# Patient Record
Sex: Male | Born: 1960 | Race: White | Hispanic: No | Marital: Married | Smoking: Never smoker
Health system: Southern US, Community
[De-identification: ages and names within clinical notes are randomized; demographics above are authoritative.]

## PROBLEM LIST (undated history)

## (undated) DIAGNOSIS — K219 Gastro-esophageal reflux disease without esophagitis: Secondary | ICD-10-CM

## (undated) DIAGNOSIS — T7840XA Allergy, unspecified, initial encounter: Secondary | ICD-10-CM

## (undated) HISTORY — DX: Allergy, unspecified, initial encounter: T78.40XA

## (undated) HISTORY — PX: VARICOCELECTOMY: SHX1084

## (undated) HISTORY — DX: Gastro-esophageal reflux disease without esophagitis: K21.9

## (undated) HISTORY — PX: OTHER SURGICAL HISTORY: SHX169

## (undated) HISTORY — PX: EYE SURGERY: SHX253

---

## 2011-03-16 ENCOUNTER — Ambulatory Visit (HOSPITAL_COMMUNITY): Payer: BC Managed Care – PPO

## 2011-03-16 ENCOUNTER — Emergency Department (HOSPITAL_COMMUNITY)
Admission: EM | Admit: 2011-03-16 | Discharge: 2011-03-16 | Discharge status: Against Medical Advice | Payer: BC Managed Care – PPO | Attending: Emergency Medicine | Admitting: Emergency Medicine

## 2011-03-16 ENCOUNTER — Emergency Department (HOSPITAL_COMMUNITY): Payer: BC Managed Care – PPO

## 2011-03-16 DIAGNOSIS — R0989 Other specified symptoms and signs involving the circulatory and respiratory systems: Secondary | ICD-10-CM | POA: Insufficient documentation

## 2011-03-16 DIAGNOSIS — R0609 Other forms of dyspnea: Secondary | ICD-10-CM | POA: Insufficient documentation

## 2011-03-16 DIAGNOSIS — R490 Dysphonia: Secondary | ICD-10-CM | POA: Insufficient documentation

## 2011-03-16 DIAGNOSIS — R1310 Dysphagia, unspecified: Secondary | ICD-10-CM | POA: Insufficient documentation

## 2011-04-17 ENCOUNTER — Other Ambulatory Visit: Payer: Self-pay | Admitting: Otolaryngology

## 2011-04-17 DIAGNOSIS — R1310 Dysphagia, unspecified: Secondary | ICD-10-CM

## 2011-04-19 ENCOUNTER — Ambulatory Visit
Admission: RE | Admit: 2011-04-19 | Discharge: 2011-04-19 | Disposition: A | Discharge status: Home / Self Care | Payer: BC Managed Care – PPO | Source: Ambulatory Visit | Attending: Otolaryngology | Admitting: Otolaryngology

## 2011-04-19 DIAGNOSIS — R1310 Dysphagia, unspecified: Secondary | ICD-10-CM

## 2011-04-23 ENCOUNTER — Other Ambulatory Visit: Payer: Self-pay | Admitting: Otolaryngology

## 2011-04-23 DIAGNOSIS — R1310 Dysphagia, unspecified: Secondary | ICD-10-CM

## 2011-04-26 ENCOUNTER — Ambulatory Visit
Admission: RE | Admit: 2011-04-26 | Discharge: 2011-04-26 | Disposition: A | Discharge status: Home / Self Care | Payer: BC Managed Care – PPO | Source: Ambulatory Visit | Attending: Otolaryngology | Admitting: Otolaryngology

## 2011-04-26 DIAGNOSIS — R1310 Dysphagia, unspecified: Secondary | ICD-10-CM

## 2011-04-26 MED ORDER — IOHEXOL 300 MG/ML  SOLN
100.0000 mL | Freq: Once | INTRAMUSCULAR | Status: AC | PRN
  Administered 2011-04-26: 100 mL via INTRAVENOUS

## 2011-05-22 ENCOUNTER — Encounter: Payer: Self-pay | Admitting: Internal Medicine

## 2011-05-24 ENCOUNTER — Encounter: Payer: Self-pay | Admitting: *Deleted

## 2011-05-27 ENCOUNTER — Encounter: Payer: Self-pay | Admitting: Internal Medicine

## 2011-05-27 ENCOUNTER — Ambulatory Visit (INDEPENDENT_AMBULATORY_CARE_PROVIDER_SITE_OTHER): Payer: BC Managed Care – PPO | Admitting: Internal Medicine

## 2011-05-27 VITALS — BP 118/62 | HR 64 | Ht 71.0 in | Wt 168.0 lb

## 2011-05-27 DIAGNOSIS — R1310 Dysphagia, unspecified: Secondary | ICD-10-CM

## 2011-05-27 DIAGNOSIS — K219 Gastro-esophageal reflux disease without esophagitis: Secondary | ICD-10-CM

## 2011-05-27 DIAGNOSIS — Z1211 Encounter for screening for malignant neoplasm of colon: Secondary | ICD-10-CM

## 2011-05-27 MED ORDER — PEG-KCL-NACL-NASULF-NA ASC-C 100 G PO SOLR: 1.0000 | Freq: Once | ORAL | Status: DC

## 2011-05-27 NOTE — Patient Instructions (Addendum)
You have been scheduled for a endoscopy and colonoscopy on 07/02/11, instructions provided. A prescription for the prep has been sent to your pharmacy.

## 2011-05-27 NOTE — Progress Notes (Signed)
HISTORY OF PRESENT ILLNESS:  Jose Rodriguez is a 50 y.o. male with no significant past medical history who is in today regarding new-onset dysphagia. The patient describes new onset intermittent solid food dysphagia beginning in early September 2012. As well, mild weight loss. He underwent ENT evaluation. The perianal softener and was performed 04/19/2011. This was said to show significant reflux, small hiatal hernia, and possible extrinsic indentation on the lower cervical esophagus. However, a barium tablet passed without delay. Subsequently, a CT scan of the neck with contrast was performed. There was no evidence for soft tissue mass or abnormality. Incidental T2 hemangioma noted. Patient denies any classic reflux symptoms. However, he was placed on PPI therapy. Currently on omeprazole 20 mg daily. Since that, he states that his symptoms have improved significantly. He continues to have no difficulties with liquids. Some difficulty with stringy items such as spinach or pizza. His weight is back to baseline. No lower GI complaints. Father with a history of adenomatous colon polyps.  REVIEW OF SYSTEMS:  All non-GI ROS negative except for waking at night  Past Medical History  Diagnosis Date  . Esophageal reflux   . Hemangioma of vertebral body     Past Surgical History  Procedure Date  . Varicose veins surgery 1992  . Cataract surgery 2008, 2010    Both eyes, different times.    Social History Jose Rodriguez  reports that he has never smoked. He does not have any smokeless tobacco history on file. He reports that he drinks alcohol. He reports that he does not use illicit drugs.  family history includes Asthma in his father; Cancer in his mother; Colon polyps in his father; and Sleep apnea in his father.  No Known Allergies     PHYSICAL EXAMINATION: Vital signs: BP 118/62  Pulse 64  Ht 5\' 11"  (1.803 m)  Wt 168 lb (76.204 kg)  BMI 23.43 kg/m2  Constitutional: generally  well-appearing, no acute distress Psychiatric: alert and oriented x3, cooperative Eyes: extraocular movements intact, anicteric, conjunctiva pink Mouth: oral pharynx moist, no lesions Neck: supple no lymphadenopathy Cardiovascular: heart regular rate and rhythm, no murmur Lungs: clear to auscultation bilaterally Abdomen: soft, nontender, nondistended, no obvious ascites, no peritoneal signs, normal bowel sounds, no organomegaly Rectal: Deferred until colonoscopy Extremities: no lower extremity edema bilaterally Skin: no lesions on visible extremities Neuro: No focal deficits.  ASSESSMENT:  #1. Vague intermittent solid food dysphagia improved on PPI. Essentially negative esophagram, except for evidence of reflux. Negative CT of the neck as well. Possible causes include subtle ring or stricture not appreciated on esophagram versus reflux induced edema---improved on PPI. #2. Colon cancer screening. Baseline risk. appropriate candidate without contraindication. I discussed this with him in detail. He is interested.   PLAN:  #1. Continue PPI #2. Upper endoscopy with possible esophageal dilation.The nature of the procedure, as well as the risks, benefits, and alternatives were carefully and thoroughly reviewed with the patient. Ample time for discussion and questions allowed. The patient understood, was satisfied, and agreed to proceed.  #3. Screening colonoscopy. Movi prep prescribed.The nature of the procedure, as well as the risks, benefits, and alternatives were carefully and thoroughly reviewed with the patient. Ample time for discussion and questions allowed. The patient understood, was satisfied, and agreed to proceed.

## 2011-07-02 ENCOUNTER — Encounter: Payer: BC Managed Care – PPO | Admitting: Internal Medicine

## 2011-07-22 ENCOUNTER — Ambulatory Visit (AMBULATORY_SURGERY_CENTER): Payer: BC Managed Care – PPO | Admitting: Internal Medicine

## 2011-07-22 ENCOUNTER — Encounter: Payer: Self-pay | Admitting: Internal Medicine

## 2011-07-22 VITALS — BP 125/76 | HR 75 | Temp 97.4°F | Resp 18 | Ht 71.0 in | Wt 168.0 lb

## 2011-07-22 DIAGNOSIS — K219 Gastro-esophageal reflux disease without esophagitis: Secondary | ICD-10-CM

## 2011-07-22 DIAGNOSIS — R1310 Dysphagia, unspecified: Secondary | ICD-10-CM

## 2011-07-22 DIAGNOSIS — K222 Esophageal obstruction: Secondary | ICD-10-CM

## 2011-07-22 DIAGNOSIS — Z1211 Encounter for screening for malignant neoplasm of colon: Secondary | ICD-10-CM

## 2011-07-22 MED ORDER — SODIUM CHLORIDE 0.9 % IV SOLN: 500.0000 mL | INTRAVENOUS | Status: DC

## 2011-07-22 NOTE — Progress Notes (Signed)
Patient did not experience any of the following events: a burn prior to discharge; a fall within the facility; wrong site/side/patient/procedure/implant event; or a hospital transfer or hospital admission upon discharge from the facility. (G8907) Patient did not have preoperative order for IV antibiotic SSI prophylaxis. (G8918)  

## 2011-07-22 NOTE — Op Note (Signed)
[facility] [address] [place], [place]  [address]  ENDOSCOPY PROCEDURE REPORT  PATIENT:  Jose Rodriguez, Jose Rodriguez  MR#:  [id number] BIRTHDATE:  1961-04-24, 50 yrs. old  GENDER:  male  ENDOSCOPIST:  Wilhemina Bonito. Eda Keys, MD Referred by:  Herb Grays, M.D.  PROCEDURE DATE:  07/22/2011 PROCEDURE:  EGD, diagnostic 43235, Maloney Dilation of Esophagus - 7F ASA CLASS:  Class I INDICATIONS:  dysphagia, GERD  MEDICATIONS:   MAC sedation, administered by CRNA, propofol (Diprivan) 150 mg IV TOPICAL ANESTHETIC:  none  DESCRIPTION OF PROCEDURE:   After the risks benefits and alternatives of the procedure were thoroughly explained, informed consent was obtained.  The LB GIF-H180 [id number] endoscope was introduced through the mouth and advanced to the second portion of the duodenum, without limitations.  The instrument was slowly withdrawn as the mucosa was fully examined. <<PROCEDUREIMAGES>>  A 15mm Schatzki's ring was found in the distal esophagus. Otherwise normal esophagus.  The stomach was entered and closely examined. The antrum, angularis, and lesser curvature were well visualized, including a retroflexed view of the cardia and fundus. The stomach wall was normally distensable. The scope passed easily through the pylorus into the duodenum.  The duodenal bulb was normal in appearance, as was the postbulbar duodenum. Retroflexed views revealed no abnormalities.    The scope was then withdrawn from the patient and the procedure completed.  THERAPY: 7F MALONEY DILATOR PASSED W/O RESISTANCE OR HEME.  COMPLICATIONS:  None  ENDOSCOPIC IMPRESSION: 1) Schatzki's ring in the distal esophagus - S/P DILATION 2) Otherwise normal esophagus 3) Normal stomach 4) Normal duodenum  RECOMMENDATIONS: 1) Clear liquids until 2 PM, then soft foods rest of day. Resume prior diet tomorrow. 2) Continue OMEPRAZOLE 3) Follow-up AS NEEDED  ______________________________ Wilhemina Bonito. Eda Keys,  MD  CC:  Herb Grays, MD;  The Patient  n. eSIGNED:   Wilhemina Bonito. Eda Keys at 07/22/2011 11:16 AM  Antony Blackbird, [phone]

## 2011-07-22 NOTE — Patient Instructions (Signed)
FOLLOW THE DISCHARGE INSTRUCTIONS ON THE BLUE AND GREEN INSTRUCTION SHEETS.  CONTINUE YOUR MEDICATIONS. CONTINUE OMEPRAZOLE.  FOLLOW POST ESOPHAGEAL DILATATION DIET INSTRUCTIONS:  NOTHING TO EAT OR DRINK UNTIL 1215. CLEAR LIQUIDS 12:15 UNTIL 2 PM. SOFT FOODS FROM 2 PM UNTIL TOMORROW. RESUME REGULAR DIET TOMORROW.  NEXT COLONOSCOPY IN 10 YEARS.

## 2011-07-22 NOTE — Op Note (Signed)
[facility] [address] [place], [place]  [address]  COLONOSCOPY PROCEDURE REPORT  PATIENT:  Jose Rodriguez, Jose Rodriguez  MR#:  [id number] BIRTHDATE:  27-Nov-1960, 50 yrs. old  GENDER:  male ENDOSCOPIST:  Wilhemina Bonito. Eda Keys, MD REF. BY:  Herb Grays, M.D. PROCEDURE DATE:  07/22/2011 PROCEDURE:  Average-risk screening colonoscopy G0121 ASA CLASS:  Class I INDICATIONS:  Routine Risk Screening MEDICATIONS:   MAC sedation, administered by CRNA, propofol (Diprivan) 250 mg IV  DESCRIPTION OF PROCEDURE:   After the risks benefits and alternatives of the procedure were thoroughly explained, informed consent was obtained.  Digital rectal exam was performed and revealed no abnormalities.   The LB 180AL [id number] endoscope was introduced through the anus and advanced to the cecum, which was identified by both the appendix and ileocecal valve, without limitations.  The quality of the prep was good, using MoviPrep. The instrument was then slowly withdrawn as the colon was fully examined. <<PROCEDUREIMAGES>>  FINDINGS:  A normal appearing cecum, ileocecal valve, and appendiceal orifice were identified. The ascending, hepatic flexure, transverse, splenic flexure, descending, sigmoid colon, and rectum appeared unremarkable.  No polyps or cancers were seen. Retroflexed views in the rectum revealed internal hemorrhoids. The time to cecum = 1:41  minutes. The scope was then withdrawn in 11:39  minutes from the cecum and the procedure completed.  COMPLICATIONS:  None  ENDOSCOPIC IMPRESSION: 1) Normal colonoscopy 2) No polyps or cancers 3) Small Internal hemorrhoids  RECOMMENDATIONS: 1) Continue current colorectal screening recommendations for "routine risk" patients with a repeat colonoscopy in 10 years. 2) Upper endoscopy today  ______________________________ Wilhemina Bonito. Eda Keys, MD  CC:  Herb Grays, MD;  The Patient  n. eSIGNED:   Wilhemina Bonito. Eda Keys at 07/22/2011 11:09 AM  Antony Blackbird, [phone]

## 2011-07-24 ENCOUNTER — Telehealth: Payer: Self-pay | Admitting: *Deleted

## 2011-07-24 NOTE — Telephone Encounter (Signed)
No answer, message left

## 2011-12-12 IMAGING — RF DG ESOPHAGUS
17 of 21 series · 19 of 24 positions shown · non-contrast
Comparison: None.

CLINICAL DATA: Dysphagia

ESOPHOGRAM/BARIUM SWALLOW
TECHNIQUE: Combined double contrast and single contrast
examination performed using effervescent crystals, thick barium
liquid, and thin barium liquid.
Fluoroscopy time:  1.2 minutes.

[Series 1: run · 2 of 6 slices shown (1 of 17)]
[im 1/6]
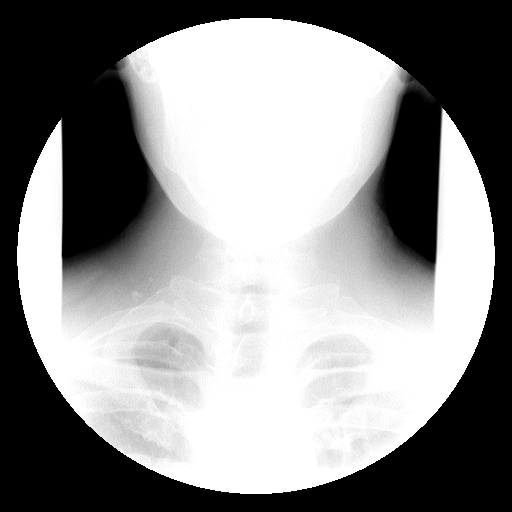
[im 3/6]
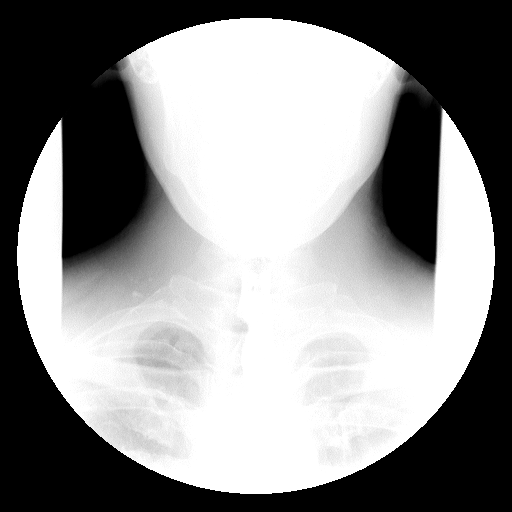

[Series 2: run · 2 of 4 slices shown (2 of 17)]
[im 1/4]
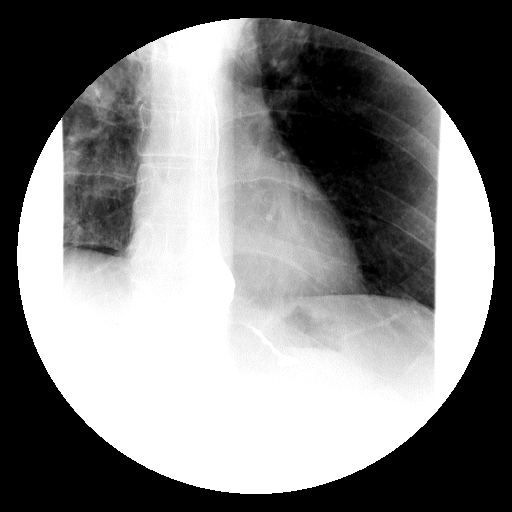
[im 4/4]
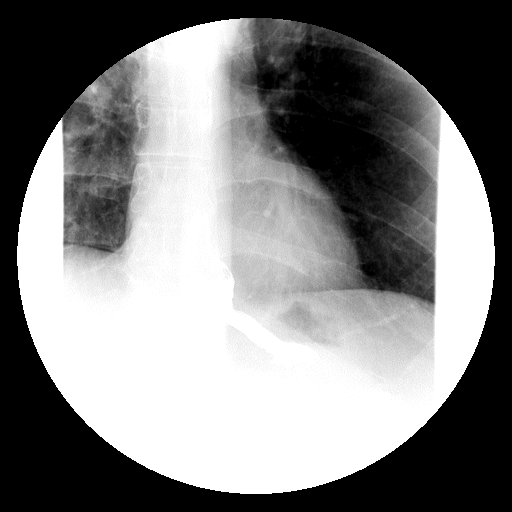

[Series 3: run · 1 of 1 slices shown (3 of 17)]
[im 1/1]
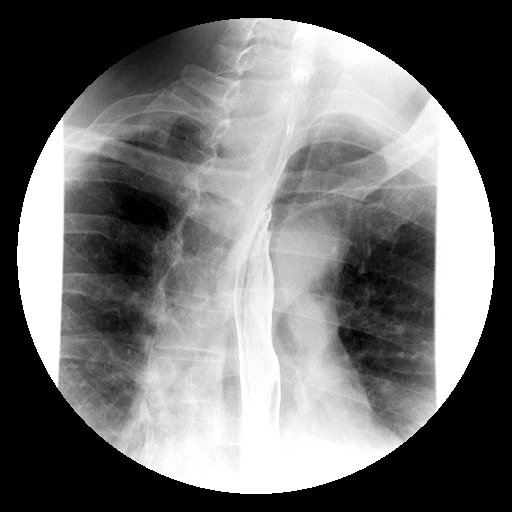

[Series 4: run · 1 of 1 slices shown (4 of 17)]
[im 1/1]
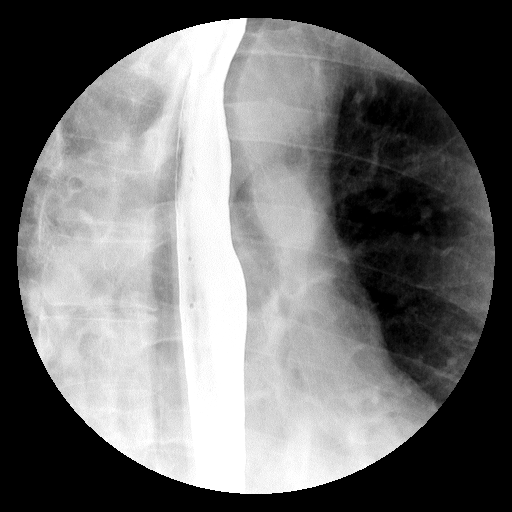

[Series 6: run · 1 of 1 slices shown (5 of 17)]
[im 1/1]
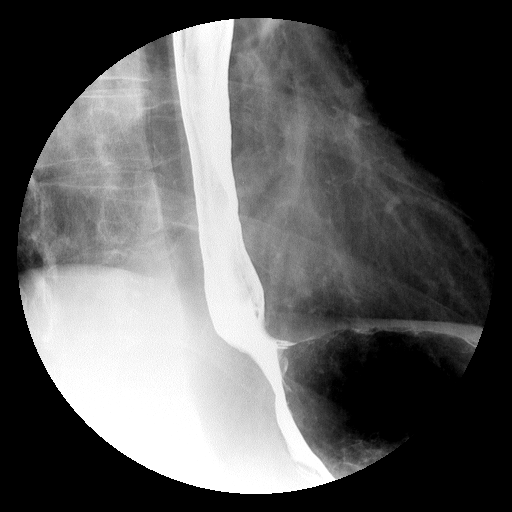

[Series 7: run · 1 of 1 slices shown (6 of 17)]
[im 1/1]
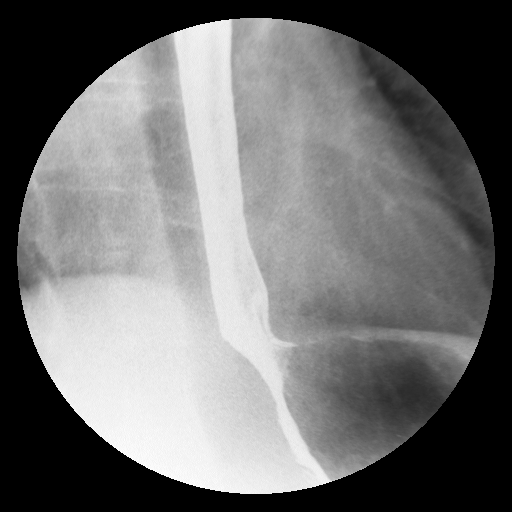

[Series 8: run · 1 of 1 slices shown (7 of 17)]
[im 1/1]
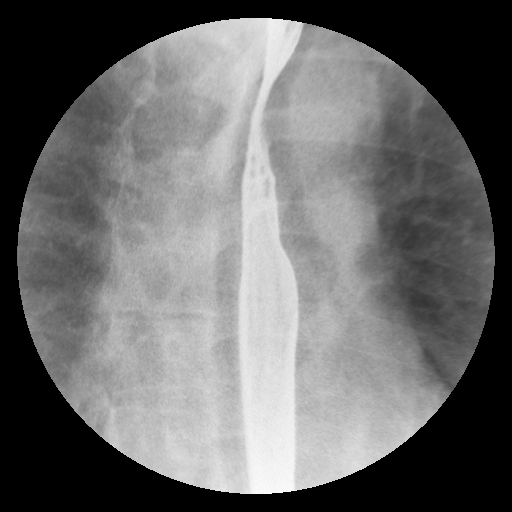

[Series 13: run · 1 of 1 slices shown (8 of 17)]
[im 1/1]
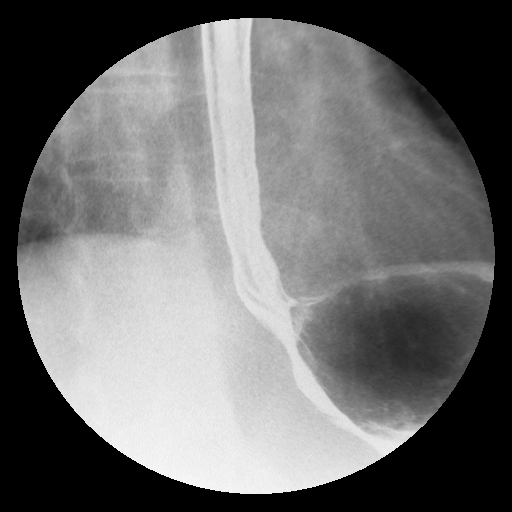

[Series 14: run · 1 of 1 slices shown (9 of 17)]
[im 1/1]
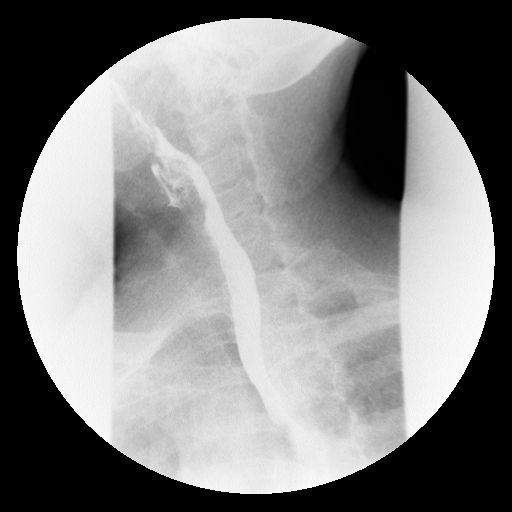

[Series 15: run · 1 of 1 slices shown (10 of 17)]
[im 1/1]
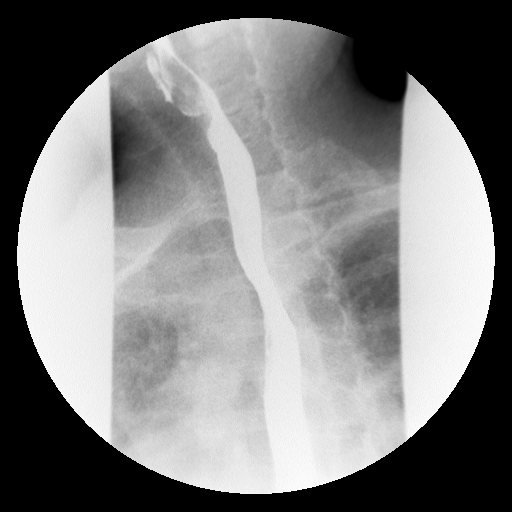

[Series 16: run · 1 of 1 slices shown (11 of 17)]
[im 1/1]
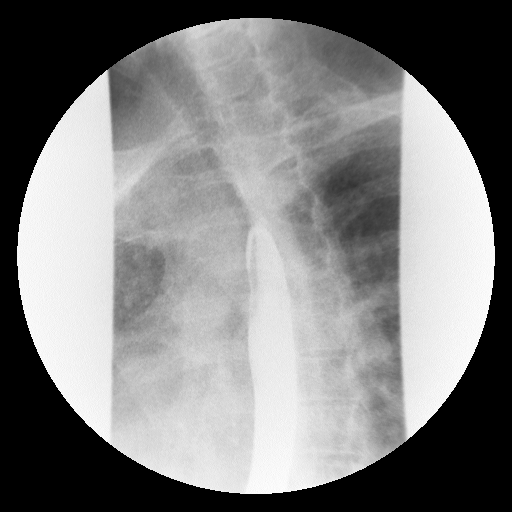

[Series 19: run · 1 of 1 slices shown (12 of 17)]
[im 1/1]
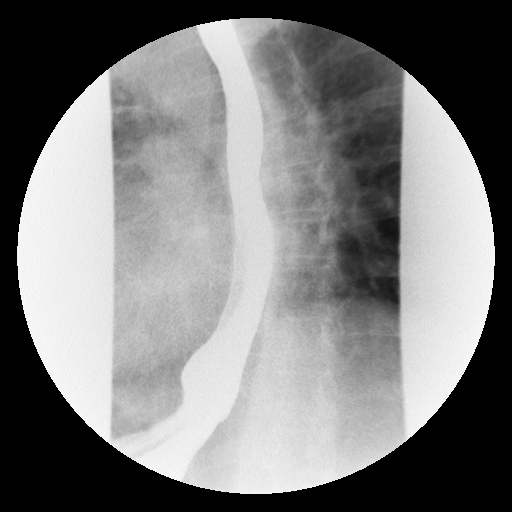

[Series 20: run · 1 of 1 slices shown (13 of 17)]
[im 1/1]
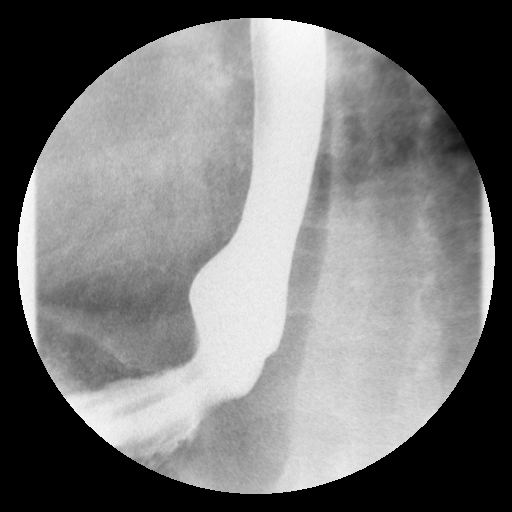

[Series 21: run · 1 of 1 slices shown (14 of 17)]
[im 1/1]
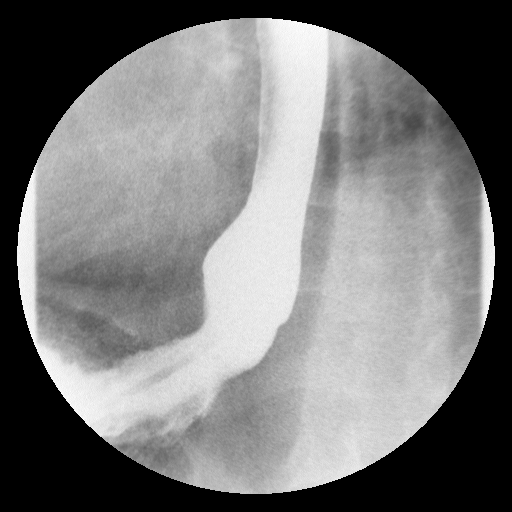

[Series 22: run · 1 of 1 slices shown (15 of 17)]
[im 1/1]
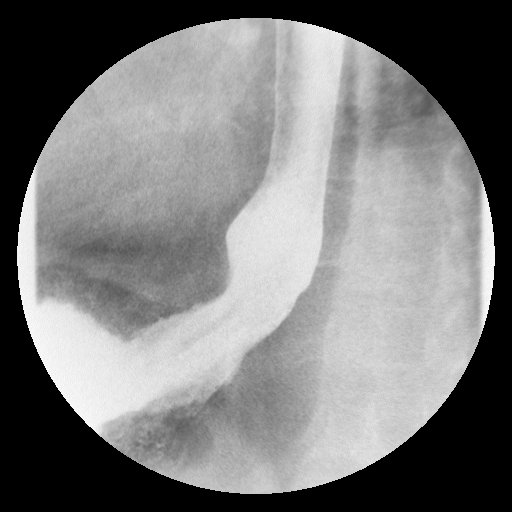

[Series 24: run · 1 of 1 slices shown (16 of 17)]
[im 1/1]
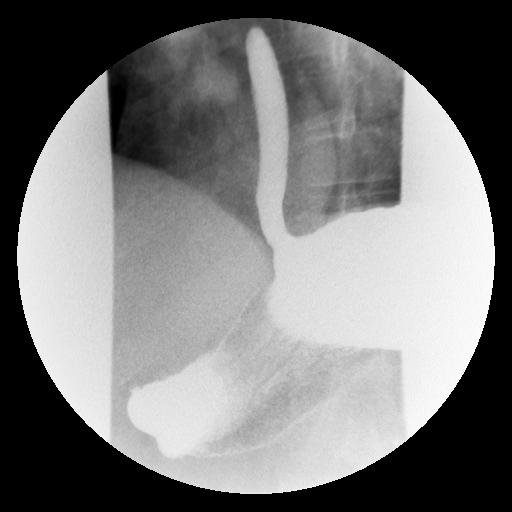

[Series 25: run · 1 of 1 slices shown (17 of 17)]
[im 1/1]
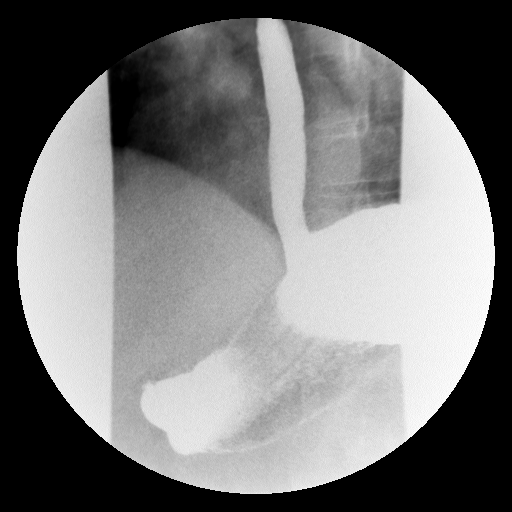

[19 of 24 positions shown; findings below may reference images not displayed]

FINDINGS: In the frontal view rapid sequence spot films of the
cervical esophagus were obtained.  There is slight indentation upon
the lower right cervical esophagus which appears extrinsic and
could be due to a thyroid nodule.  Clinical correlation is
recommended.  Otherwise esophageal peristalsis is normal.  A double
contrast study shows the mucosa of the esophagus to be
unremarkable.  There does appear to be a small hiatal hernia
present.  Significant gastroesophageal reflux is demonstrated.  A
barium pill was given at the end of study which passed into the
stomach without delay.
IMPRESSION: 1.  Significant gastroesophageal reflux.  Barium pill passes into
the stomach without delay.
2.  Question extrinsic indentation upon the lower cervical
esophagus to the right of midline.  Possible thyroid nodule.
Correlate clinically.
3.  Small hiatal hernia.

## 2015-03-02 ENCOUNTER — Other Ambulatory Visit: Payer: Self-pay | Admitting: Orthopedic Surgery

## 2015-03-21 NOTE — Pre-Procedure Instructions (Signed)
Jose Rodriguez  03/21/2015      [facility] - [place], [place] - [address] OF [address] [place] [address] Phone: [phone] Fax: [phone]    Your procedure is scheduled on Wednesday, March 29, 2015  Report to [facility] Admitting at 11:15 A.M.  Call this number if you have problems the morning of surgery:  [phone]   Remember:  Do not eat food or drink liquids after midnight Tuesday, March 28, 2015  Take these medicines the morning of surgery with A SIP OF WATER:    omeprazole (PRILOSEC)  Stop taking Aspirin, vitamins and herbal medications. Do not take any NSAIDs ie: Ibuprofen, Advil, Naproxen or any medication containing Aspirin; stop now.  Do not wear jewelry, make-up or nail polish.  Do not wear lotions, powders, or perfumes.  You may not wear deodorant.  Do not shave 48 hours prior to surgery.  Men may shave face and neck.  Do not bring valuables to the hospital.  [facility] is not responsible for any belongings or valuables.  Contacts, dentures or bridgework may not be worn into surgery.  Leave your suitcase in the car.  After surgery it may be brought to your room.  For patients admitted to the hospital, discharge time will be determined by your treatment team.  Patients discharged the day of surgery will not be allowed to drive home.   Name and phone number of your driver:   Jose Rodriguez   Special instructions: Special Instructions:Special Instructions: [facility] - Preparing for Surgery  Before surgery, you can play an important role.  Because skin is not sterile, your skin needs to be as free of germs as possible.  You can reduce the number of germs on you skin by washing with CHG (chlorahexidine gluconate) soap before surgery.  CHG is an antiseptic cleaner which kills germs and bonds with the skin to continue killing germs even after washing.  Please DO NOT  use if you have an allergy to CHG or antibacterial soaps.  If your skin becomes reddened/irritated stop using the CHG and inform your nurse when you arrive at Short Stay.  Do not shave (including legs and underarms) for at least 48 hours prior to the first CHG shower.  You may shave your face.  Please follow these instructions carefully:   1.  Shower with CHG Soap the night before surgery and the morning of Surgery.  2.  If you choose to wash your hair, wash your hair first as usual with your normal shampoo.  3.  After you shampoo, rinse your hair and body thoroughly to remove the Shampoo.  4.  Use CHG as you would any other liquid soap.  You can apply chg directly  to the skin and wash gently with scrungie or a clean washcloth.  5.  Apply the CHG Soap to your body ONLY FROM THE NECK DOWN.  Do not use on open wounds or open sores.  Avoid contact with your eyes, ears, mouth and genitals (private parts).  Wash genitals (private parts) with your normal soap.  6.  Wash thoroughly, paying special attention to the area where your surgery will be performed.  7.  Thoroughly rinse your body with warm water from the neck down.  8.  DO NOT shower/wash with your normal soap after using and rinsing off the CHG Soap.  9.  Pat yourself dry with a clean towel.            10.  Wear clean pajamas.            11.  Place clean sheets on your bed the night of your first shower and do not sleep with pets.  Day of Surgery  Do not apply any lotions/deodorants the morning of surgery.  Please wear clean clothes to the hospital/surgery center.  Please read over the following fact sheets that you were given. Pain Booklet, Coughing and Deep Breathing, MRSA Information and Surgical Site Infection Prevention

## 2015-03-22 ENCOUNTER — Encounter (HOSPITAL_COMMUNITY): Payer: Self-pay

## 2015-03-22 ENCOUNTER — Encounter (HOSPITAL_COMMUNITY)
Admission: RE | Admit: 2015-03-22 | Discharge: 2015-03-22 | Disposition: A | Discharge status: Home / Self Care | Payer: BLUE CROSS/BLUE SHIELD | Source: Ambulatory Visit | Attending: Orthopedic Surgery | Admitting: Orthopedic Surgery

## 2015-03-22 DIAGNOSIS — Z01812 Encounter for preprocedural laboratory examination: Secondary | ICD-10-CM | POA: Insufficient documentation

## 2015-03-22 DIAGNOSIS — M79605 Pain in left leg: Secondary | ICD-10-CM | POA: Diagnosis not present

## 2015-03-22 DIAGNOSIS — Z01818 Encounter for other preprocedural examination: Secondary | ICD-10-CM

## 2015-03-22 LAB — CBC WITH DIFFERENTIAL/PLATELET
Basophils Absolute: 0 10*3/uL (ref 0.0–0.1)
Basophils Relative: 0 % (ref 0–1)
Eosinophils Absolute: 0.1 10*3/uL (ref 0.0–0.7)
Eosinophils Relative: 1 % (ref 0–5)
HEMATOCRIT: 38.9 % — AB (ref 39.0–52.0)
Hemoglobin: 13.4 g/dL (ref 13.0–17.0)
LYMPHS PCT: 22 % (ref 12–46)
Lymphs Abs: 1.1 10*3/uL (ref 0.7–4.0)
MCH: 29.8 pg (ref 26.0–34.0)
MCHC: 34.4 g/dL (ref 30.0–36.0)
MCV: 86.4 fL (ref 78.0–100.0)
MONO ABS: 0.4 10*3/uL (ref 0.1–1.0)
MONOS PCT: 7 % (ref 3–12)
NEUTROS ABS: 3.6 10*3/uL (ref 1.7–7.7)
Neutrophils Relative %: 70 % (ref 43–77)
Platelets: 232 10*3/uL (ref 150–400)
RBC: 4.5 MIL/uL (ref 4.22–5.81)
RDW: 12.7 % (ref 11.5–15.5)
WBC: 5.1 10*3/uL (ref 4.0–10.5)

## 2015-03-22 LAB — COMPREHENSIVE METABOLIC PANEL
ALT: 17 U/L (ref 17–63)
ANION GAP: 4 — AB (ref 5–15)
AST: 23 U/L (ref 15–41)
Albumin: 3.9 g/dL (ref 3.5–5.0)
Alkaline Phosphatase: 52 U/L (ref 38–126)
BILIRUBIN TOTAL: 0.7 mg/dL (ref 0.3–1.2)
BUN: 10 mg/dL (ref 6–20)
CO2: 30 mmol/L (ref 22–32)
Calcium: 9.1 mg/dL (ref 8.9–10.3)
Chloride: 104 mmol/L (ref 101–111)
Creatinine, Ser: 1 mg/dL (ref 0.61–1.24)
GFR calc Af Amer: >60 mL/min (ref >60)
Glucose, Bld: 113 mg/dL — ABNORMAL HIGH (ref 65–99)
POTASSIUM: 4.4 mmol/L (ref 3.5–5.1)
Sodium: 138 mmol/L (ref 135–145)
TOTAL PROTEIN: 6.8 g/dL (ref 6.5–8.1)

## 2015-03-22 LAB — URINALYSIS, ROUTINE W REFLEX MICROSCOPIC
BILIRUBIN URINE: NEGATIVE
GLUCOSE, UA: NEGATIVE mg/dL
HGB URINE DIPSTICK: NEGATIVE
Ketones, ur: NEGATIVE mg/dL
Leukocytes, UA: NEGATIVE
Nitrite: NEGATIVE
Protein, ur: NEGATIVE mg/dL
SPECIFIC GRAVITY, URINE: 1.011 (ref 1.005–1.030)
UROBILINOGEN UA: 0.2 mg/dL (ref 0.0–1.0)
pH: 7 (ref 5.0–8.0)

## 2015-03-22 LAB — SURGICAL PCR SCREEN
MRSA, PCR: NEGATIVE
Staphylococcus aureus: POSITIVE — AB

## 2015-03-22 LAB — PROTIME-INR
INR: 1.06 (ref 0.00–1.49)
PROTHROMBIN TIME: 14 s (ref 11.6–15.2)

## 2015-03-22 LAB — APTT: aPTT: 26 seconds (ref 24–37)

## 2015-03-28 NOTE — Progress Notes (Signed)
Patient called with time change. message left on machine for patient to arrive at 130 pm

## 2015-03-29 ENCOUNTER — Ambulatory Visit (HOSPITAL_COMMUNITY): Payer: BLUE CROSS/BLUE SHIELD | Admitting: Anesthesiology

## 2015-03-29 ENCOUNTER — Ambulatory Visit (HOSPITAL_COMMUNITY)
Admission: RE | Admit: 2015-03-29 | Discharge: 2015-03-29 | Disposition: A | Discharge status: Home / Self Care | Payer: BLUE CROSS/BLUE SHIELD | Source: Ambulatory Visit | Attending: Orthopedic Surgery | Admitting: Orthopedic Surgery

## 2015-03-29 ENCOUNTER — Ambulatory Visit (HOSPITAL_COMMUNITY): Payer: BLUE CROSS/BLUE SHIELD

## 2015-03-29 ENCOUNTER — Encounter (HOSPITAL_COMMUNITY): Payer: Self-pay | Admitting: General Practice

## 2015-03-29 ENCOUNTER — Encounter (HOSPITAL_COMMUNITY)
Admission: RE | Disposition: A | Discharge status: Home / Self Care | Payer: Self-pay | Source: Ambulatory Visit | Attending: Orthopedic Surgery

## 2015-03-29 DIAGNOSIS — K219 Gastro-esophageal reflux disease without esophagitis: Secondary | ICD-10-CM | POA: Insufficient documentation

## 2015-03-29 DIAGNOSIS — Z791 Long term (current) use of non-steroidal anti-inflammatories (NSAID): Secondary | ICD-10-CM | POA: Insufficient documentation

## 2015-03-29 DIAGNOSIS — Z419 Encounter for procedure for purposes other than remedying health state, unspecified: Secondary | ICD-10-CM

## 2015-03-29 DIAGNOSIS — M5116 Intervertebral disc disorders with radiculopathy, lumbar region: Secondary | ICD-10-CM | POA: Diagnosis not present

## 2015-03-29 DIAGNOSIS — M5410 Radiculopathy, site unspecified: Secondary | ICD-10-CM

## 2015-03-29 HISTORY — PX: LUMBAR LAMINECTOMY/DECOMPRESSION MICRODISCECTOMY: SHX5026

## 2015-03-29 SURGERY — LUMBAR LAMINECTOMY/DECOMPRESSION MICRODISCECTOMY
Anesthesia: General | Site: Spine Lumbar

## 2015-03-29 MED ORDER — LACTATED RINGERS IV SOLN
INTRAVENOUS | Status: DC
  Administered 2015-03-29: 13:00:00 via INTRAVENOUS

## 2015-03-29 MED ORDER — METHYLPREDNISOLONE ACETATE 40 MG/ML IJ SUSP
INTRAMUSCULAR | Status: AC
  Filled 2015-03-29: qty 1

## 2015-03-29 MED ORDER — THROMBIN 20000 UNITS EX SOLR
CUTANEOUS | Status: AC
  Filled 2015-03-29: qty 20000

## 2015-03-29 MED ORDER — ONDANSETRON HCL 4 MG/2ML IJ SOLN
INTRAMUSCULAR | Status: AC
  Filled 2015-03-29: qty 2

## 2015-03-29 MED ORDER — MIDAZOLAM HCL 2 MG/2ML IJ SOLN
INTRAMUSCULAR | Status: AC
  Filled 2015-03-29: qty 4

## 2015-03-29 MED ORDER — THROMBIN 20000 UNITS EX SOLR
CUTANEOUS | Status: DC | PRN
  Administered 2015-03-29: 20 mL via TOPICAL

## 2015-03-29 MED ORDER — BUPIVACAINE-EPINEPHRINE (PF) 0.25% -1:200000 IJ SOLN
INTRAMUSCULAR | Status: AC
  Filled 2015-03-29: qty 30

## 2015-03-29 MED ORDER — DEXAMETHASONE SODIUM PHOSPHATE 4 MG/ML IJ SOLN
INTRAMUSCULAR | Status: DC | PRN
  Administered 2015-03-29: 4 mg via INTRAVENOUS

## 2015-03-29 MED ORDER — PROPOFOL 10 MG/ML IV BOLUS
INTRAVENOUS | Status: DC | PRN
  Administered 2015-03-29: 180 mg via INTRAVENOUS

## 2015-03-29 MED ORDER — FENTANYL CITRATE (PF) 100 MCG/2ML IJ SOLN
INTRAMUSCULAR | Status: DC | PRN
  Administered 2015-03-29: 100 ug via INTRAVENOUS
  Administered 2015-03-29: 50 ug via INTRAVENOUS
  Administered 2015-03-29: 150 ug via INTRAVENOUS
  Administered 2015-03-29: 50 ug via INTRAVENOUS

## 2015-03-29 MED ORDER — PHENYLEPHRINE HCL 10 MG/ML IJ SOLN
INTRAMUSCULAR | Status: DC | PRN
  Administered 2015-03-29 (×5): 80 ug via INTRAVENOUS

## 2015-03-29 MED ORDER — MIDAZOLAM HCL 5 MG/5ML IJ SOLN
INTRAMUSCULAR | Status: DC | PRN
  Administered 2015-03-29: 2 mg via INTRAVENOUS

## 2015-03-29 MED ORDER — HYDROMORPHONE HCL 1 MG/ML IJ SOLN
0.2500 mg | INTRAMUSCULAR | Status: DC | PRN
  Administered 2015-03-29 (×4): 0.5 mg via INTRAVENOUS

## 2015-03-29 MED ORDER — LIDOCAINE HCL (CARDIAC) 20 MG/ML IV SOLN
INTRAVENOUS | Status: DC | PRN
  Administered 2015-03-29: 60 mg via INTRAVENOUS

## 2015-03-29 MED ORDER — LACTATED RINGERS IV SOLN
INTRAVENOUS | Status: DC | PRN
  Administered 2015-03-29 (×2): via INTRAVENOUS

## 2015-03-29 MED ORDER — OXYCODONE HCL 5 MG PO TABS
5.0000 mg | ORAL_TABLET | Freq: Once | ORAL | Status: AC | PRN
  Administered 2015-03-29: 5 mg via ORAL

## 2015-03-29 MED ORDER — BUPIVACAINE-EPINEPHRINE 0.25% -1:200000 IJ SOLN
INTRAMUSCULAR | Status: DC | PRN
  Administered 2015-03-29: 15 mL

## 2015-03-29 MED ORDER — ACETAMINOPHEN 160 MG/5ML PO SOLN
325.0000 mg | ORAL | Status: DC | PRN
  Filled 2015-03-29: qty 20.3

## 2015-03-29 MED ORDER — LIDOCAINE HCL (CARDIAC) 20 MG/ML IV SOLN
INTRAVENOUS | Status: AC
  Filled 2015-03-29: qty 5

## 2015-03-29 MED ORDER — CEFAZOLIN SODIUM-DEXTROSE 2-3 GM-% IV SOLR
2.0000 g | INTRAVENOUS | Status: AC
  Administered 2015-03-29: 2 g via INTRAVENOUS
  Filled 2015-03-29: qty 50

## 2015-03-29 MED ORDER — 0.9 % SODIUM CHLORIDE (POUR BTL) OPTIME
TOPICAL | Status: DC | PRN
  Administered 2015-03-29: 1000 mL

## 2015-03-29 MED ORDER — ROCURONIUM BROMIDE 50 MG/5ML IV SOLN
INTRAVENOUS | Status: AC
  Filled 2015-03-29: qty 1

## 2015-03-29 MED ORDER — DIAZEPAM 5 MG PO TABS
ORAL_TABLET | ORAL | Status: AC
  Administered 2015-03-29: 5 mg
  Filled 2015-03-29: qty 1

## 2015-03-29 MED ORDER — FENTANYL CITRATE (PF) 250 MCG/5ML IJ SOLN
INTRAMUSCULAR | Status: AC
  Filled 2015-03-29: qty 5

## 2015-03-29 MED ORDER — POVIDONE-IODINE 7.5 % EX SOLN: Freq: Once | CUTANEOUS | Status: DC

## 2015-03-29 MED ORDER — METHYLPREDNISOLONE ACETATE 40 MG/ML IJ SUSP
INTRAMUSCULAR | Status: DC | PRN
  Administered 2015-03-29: 40 mg via INTRA_ARTICULAR

## 2015-03-29 MED ORDER — EPHEDRINE SULFATE 50 MG/ML IJ SOLN
INTRAMUSCULAR | Status: DC | PRN
  Administered 2015-03-29 (×2): 10 mg via INTRAVENOUS

## 2015-03-29 MED ORDER — OXYCODONE HCL 5 MG PO TABS
ORAL_TABLET | ORAL | Status: AC
  Filled 2015-03-29: qty 1

## 2015-03-29 MED ORDER — PROPOFOL INFUSION 10 MG/ML OPTIME
INTRAVENOUS | Status: DC | PRN
  Administered 2015-03-29: 20 ug/kg/min via INTRAVENOUS

## 2015-03-29 MED ORDER — PROPOFOL 10 MG/ML IV BOLUS
INTRAVENOUS | Status: AC
  Filled 2015-03-29: qty 20

## 2015-03-29 MED ORDER — HYDROMORPHONE HCL 1 MG/ML IJ SOLN
INTRAMUSCULAR | Status: AC
  Filled 2015-03-29: qty 1

## 2015-03-29 MED ORDER — METHYLENE BLUE 1 % INJ SOLN
INTRAMUSCULAR | Status: AC
  Filled 2015-03-29: qty 10

## 2015-03-29 MED ORDER — OXYCODONE HCL 5 MG/5ML PO SOLN: 5.0000 mg | Freq: Once | ORAL | Status: AC | PRN

## 2015-03-29 MED ORDER — SUCCINYLCHOLINE CHLORIDE 20 MG/ML IJ SOLN
INTRAMUSCULAR | Status: DC | PRN
  Administered 2015-03-29: 80 mg via INTRAVENOUS

## 2015-03-29 MED ORDER — ACETAMINOPHEN 325 MG PO TABS: 325.0000 mg | ORAL_TABLET | ORAL | Status: DC | PRN

## 2015-03-29 SURGICAL SUPPLY — 68 items
BENZOIN TINCTURE PRP APPL 2/3 (GAUZE/BANDAGES/DRESSINGS) ×2 IMPLANT
BUR ROUND PRECISION 4.0 (BURR) ×2 IMPLANT
CANISTER SUCTION 2500CC (MISCELLANEOUS) ×2 IMPLANT
CARTRIDGE OIL MAESTRO DRILL (MISCELLANEOUS) ×1 IMPLANT
CORDS BIPOLAR (ELECTRODE) ×2 IMPLANT
COVER SURGICAL LIGHT HANDLE (MISCELLANEOUS) ×2 IMPLANT
DIFFUSER DRILL AIR PNEUMATIC (MISCELLANEOUS) ×2 IMPLANT
DRAIN CHANNEL 15F RND FF W/TCR (WOUND CARE) IMPLANT
DRAPE POUCH INSTRU U-SHP 10X18 (DRAPES) ×4 IMPLANT
DRAPE SURG 17X23 STRL (DRAPES) ×8 IMPLANT
DURAPREP 26ML APPLICATOR (WOUND CARE) ×2 IMPLANT
ELECT BLADE 4.0 EZ CLEAN MEGAD (MISCELLANEOUS) ×2
ELECT CAUTERY BLADE 6.4 (BLADE) IMPLANT
ELECT REM PT RETURN 9FT ADLT (ELECTROSURGICAL) ×2
ELECTRODE BLDE 4.0 EZ CLN MEGD (MISCELLANEOUS) ×1 IMPLANT
ELECTRODE REM PT RTRN 9FT ADLT (ELECTROSURGICAL) ×1 IMPLANT
EVACUATOR SILICONE 100CC (DRAIN) IMPLANT
FILTER STRAW FLUID ASPIR (MISCELLANEOUS) ×2 IMPLANT
GAUZE SPONGE 4X4 12PLY STRL (GAUZE/BANDAGES/DRESSINGS) ×2 IMPLANT
GAUZE SPONGE 4X4 16PLY XRAY LF (GAUZE/BANDAGES/DRESSINGS) ×4 IMPLANT
GLOVE BIO SURGEON STRL SZ7 (GLOVE) ×2 IMPLANT
GLOVE BIO SURGEON STRL SZ8 (GLOVE) ×2 IMPLANT
GLOVE BIOGEL PI IND STRL 7.0 (GLOVE) ×1 IMPLANT
GLOVE BIOGEL PI IND STRL 8 (GLOVE) ×1 IMPLANT
GLOVE BIOGEL PI INDICATOR 7.0 (GLOVE) ×1
GLOVE BIOGEL PI INDICATOR 8 (GLOVE) ×1
GOWN STRL REUS W/ TWL LRG LVL3 (GOWN DISPOSABLE) ×1 IMPLANT
GOWN STRL REUS W/ TWL XL LVL3 (GOWN DISPOSABLE) ×2 IMPLANT
GOWN STRL REUS W/TWL LRG LVL3 (GOWN DISPOSABLE) ×1
GOWN STRL REUS W/TWL XL LVL3 (GOWN DISPOSABLE) ×2
IV CATH 14GX2 1/4 (CATHETERS) ×2 IMPLANT
KIT BASIN OR (CUSTOM PROCEDURE TRAY) ×2 IMPLANT
KIT POSITION SURG JACKSON T1 (MISCELLANEOUS) ×2 IMPLANT
KIT ROOM TURNOVER OR (KITS) ×2 IMPLANT
MARKER SKIN DUAL TIP RULER LAB (MISCELLANEOUS) ×2 IMPLANT
NEEDLE 18GX1X1/2 (RX/OR ONLY) (NEEDLE) ×2 IMPLANT
NEEDLE 22X1 1/2 (OR ONLY) (NEEDLE) ×2 IMPLANT
NEEDLE HYPO 25GX1X1/2 BEV (NEEDLE) ×2 IMPLANT
NEEDLE SPNL 18GX3.5 QUINCKE PK (NEEDLE) ×8 IMPLANT
NS IRRIG 1000ML POUR BTL (IV SOLUTION) ×2 IMPLANT
OIL CARTRIDGE MAESTRO DRILL (MISCELLANEOUS) ×2
PACK LAMINECTOMY ORTHO (CUSTOM PROCEDURE TRAY) ×2 IMPLANT
PACK UNIVERSAL I (CUSTOM PROCEDURE TRAY) ×2 IMPLANT
PAD ARMBOARD 7.5X6 YLW CONV (MISCELLANEOUS) ×4 IMPLANT
PATTIES SURGICAL .5 X.5 (GAUZE/BANDAGES/DRESSINGS) IMPLANT
PATTIES SURGICAL .5 X1 (DISPOSABLE) ×2 IMPLANT
SPONGE INTESTINAL PEANUT (DISPOSABLE) ×2 IMPLANT
SPONGE SURGIFOAM ABS GEL 100 (HEMOSTASIS) ×2 IMPLANT
SPONGE SURGIFOAM ABS GEL SZ50 (HEMOSTASIS) ×2 IMPLANT
STRIP CLOSURE SKIN 1/2X4 (GAUZE/BANDAGES/DRESSINGS) ×2 IMPLANT
SURGIFLO W/THROMBIN 8M KIT (HEMOSTASIS) IMPLANT
SUT MNCRL AB 4-0 PS2 18 (SUTURE) ×2 IMPLANT
SUT VIC AB 0 CT1 18XCR BRD 8 (SUTURE) IMPLANT
SUT VIC AB 0 CT1 27 (SUTURE)
SUT VIC AB 0 CT1 27XBRD ANBCTR (SUTURE) IMPLANT
SUT VIC AB 0 CT1 8-18 (SUTURE)
SUT VIC AB 1 CT1 18XCR BRD 8 (SUTURE) ×1 IMPLANT
SUT VIC AB 1 CT1 8-18 (SUTURE) ×1
SUT VIC AB 2-0 CT2 18 VCP726D (SUTURE) ×2 IMPLANT
SYR 20CC LL (SYRINGE) IMPLANT
SYR BULB IRRIGATION 50ML (SYRINGE) ×2 IMPLANT
SYR CONTROL 10ML LL (SYRINGE) ×4 IMPLANT
SYR TB 1ML 26GX3/8 SAFETY (SYRINGE) ×4 IMPLANT
SYR TB 1ML LUER SLIP (SYRINGE) ×4 IMPLANT
TOWEL OR 17X24 6PK STRL BLUE (TOWEL DISPOSABLE) ×2 IMPLANT
TOWEL OR 17X26 10 PK STRL BLUE (TOWEL DISPOSABLE) ×2 IMPLANT
WATER STERILE IRR 1000ML POUR (IV SOLUTION) IMPLANT
YANKAUER SUCT BULB TIP NO VENT (SUCTIONS) ×2 IMPLANT

## 2015-03-29 NOTE — H&P (Signed)
     PREOPERATIVE H&P  Chief Complaint: left thigh pain  HPI: Jose Rodriguez is a 54 y.o. 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 Esophageal reflux   . Allergy     seasonal  . Cataract    Past Surgical History  Procedure Laterality Date  . Cataract surgery  2008, 2010    Both eyes, different times.  . Eye surgery    . Varicocelectomy      2001  in [place], [place]   Social History   Social History  . Marital Status: Married    Spouse Name: N/A  . Number of Children: N/A  . Years of Education: N/A   Social History Main Topics  . Smoking status: Never Smoker   . Smokeless tobacco: Never Used  . Alcohol Use: Yes     Comment: Very rarely.  . Drug Use: No  . Sexual Activity: Not on file   Other Topics Concern  . Not on file   Social History Narrative   For exercise, walks dog.   Family History  Problem Relation Age of Onset  . Asthma Father     Brother   . Cancer Mother     Breast cancer survivor  . Sleep apnea Father     Uses CPAP  . Colon polyps Father     polyps removed   No Known Allergies Prior to Admission medications   Medication Sig Start Date End Date Taking? Authorizing Provider  gabapentin (NEURONTIN) 100 MG capsule Take 100 mg by mouth daily.    Yes Historical Provider, MD  meloxicam (MOBIC) 7.5 MG tablet Take 7.5 mg by mouth daily.   Yes Historical Provider, MD  omeprazole (PRILOSEC) 20 MG capsule Take 20 mg by mouth daily. Take 1 cap in the Am and before dinner.    Yes Historical Provider, MD     All other systems have been reviewed and were otherwise negative with the exception of those mentioned in the HPI and as above.  Physical Exam: There were no vitals  filed for this visit.  General: Alert, no acute distress Cardiovascular: No pedal edema Respiratory: No cyanosis, no use of accessory musculature Skin: No lesions in the area of chief complaint Neurologic: Sensation intact distally Psychiatric: Patient is competent for consent with normal mood and affect Lymphatic: No axillary or cervical lymphadenopathy  Assessment/Plan: Left leg pain Plan for Procedure(s): LUMBAR LAMINECTOMY/DECOMPRESSION MICRODISCECTOMY   Sinclair Ship, MD 03/29/2015 6:54 AM

## 2015-03-29 NOTE — Anesthesia Procedure Notes (Signed)
Procedure Name: Intubation Date/Time: 03/29/2015 4:42 PM Performed by: Raphael Gibney T Pre-anesthesia Checklist: Patient identified, Timeout performed, Emergency Drugs available, Suction available and Patient being monitored Patient Re-evaluated:Patient Re-evaluated prior to inductionOxygen Delivery Method: Circle system utilized and Simple face mask Preoxygenation: Pre-oxygenation with 100% oxygen Intubation Type: IV induction Ventilation: Mask ventilation without difficulty Laryngoscope Size: Miller and 3 Grade View: Grade I Tube type: Oral Tube size: 7.5 mm Number of attempts: 1 Airway Equipment and Method: Patient positioned with wedge pillow and Stylet Placement Confirmation: ETT inserted through vocal cords under direct vision,  positive ETCO2 and breath sounds checked- equal and bilateral Secured at: 22 cm Tube secured with: Tape Dental Injury: Teeth and Oropharynx as per pre-operative assessment

## 2015-03-29 NOTE — Anesthesia Postprocedure Evaluation (Signed)
Anesthesia Post Note  Patient: Jose Rodriguez  Procedure(s) Performed: Procedure(s) (LRB): LUMBAR LAMINECTOMY/DECOMPRESSION MICRODISCECTOMY LUMBAR THREE-FOUR (N/A)  Anesthesia type: General  Patient location: PACU  Post pain: Pain level controlled and Adequate analgesia  Post assessment: Post-op Vital signs reviewed, Patient's Cardiovascular Status Stable, Respiratory Function Stable, Patent Airway and Pain level controlled  Last Vitals:  Filed Vitals:   03/29/15 2045  BP: 111/54  Pulse: 118  Temp:   Resp:     Post vital signs: Reviewed and stable  Level of consciousness: awake, alert  and oriented  Complications: No apparent anesthesia complications

## 2015-03-29 NOTE — Anesthesia Preprocedure Evaluation (Addendum)
Anesthesia Evaluation  Patient identified by MRN, date of birth, ID band Patient awake    Reviewed: Allergy & Precautions, NPO status , Patient's Chart, lab work & pertinent test results  History of Anesthesia Complications Negative for: history of anesthetic complications  Airway Mallampati: II  TM Distance: >3 FB     Dental  (+) Teeth Intact, Dental Advisory Given   Pulmonary neg pulmonary ROS,    breath sounds clear to auscultation       Cardiovascular negative cardio ROS   Rhythm:Regular     Neuro/Psych negative neurological ROS  negative psych ROS   GI/Hepatic Neg liver ROS, GERD  Medicated and Controlled,  Endo/Other  negative endocrine ROS  Renal/GU negative Renal ROS     Musculoskeletal negative musculoskeletal ROS (+)   Abdominal   Peds  Hematology negative hematology ROS (+)   Anesthesia Other Findings   Reproductive/Obstetrics                            Anesthesia Physical Anesthesia Plan  ASA: I  Anesthesia Plan: General   Post-op Pain Management:    Induction: Intravenous  Airway Management Planned: Oral ETT  Additional Equipment: None  Intra-op Plan:   Post-operative Plan: Extubation in OR  Informed Consent:   Dental advisory given  Plan Discussed with: CRNA and Surgeon  Anesthesia Plan Comments:        Anesthesia Quick Evaluation

## 2015-03-29 NOTE — Transfer of Care (Signed)
Immediate Anesthesia Transfer of Care Note  Patient: Jose Rodriguez  Procedure(s) Performed: Procedure(s) with comments: LUMBAR LAMINECTOMY/DECOMPRESSION MICRODISCECTOMY LUMBAR THREE-FOUR (N/A) - Lumbar 3-4 decompression  Patient Location: PACU  Anesthesia Type:General  Level of Consciousness: awake, alert  and patient cooperative  Airway & Oxygen Therapy: Patient Spontanous Breathing  Post-op Assessment: Report given to RN and Post -op Vital signs reviewed and stable  Post vital signs: Reviewed and stable  Last Vitals:  Filed Vitals:   03/29/15 1212  BP: 123/60  Pulse: 74  Temp: 36.6 C  Resp: 18    Complications: No apparent anesthesia complications

## 2015-03-30 ENCOUNTER — Encounter (HOSPITAL_COMMUNITY): Payer: Self-pay | Admitting: Orthopedic Surgery

## 2015-03-30 NOTE — Op Note (Signed)
Jose, Rodriguez NO.:  [id number]  MEDICAL RECORD NO.:  [id number]  LOCATION:  [facility]                         FACILITY:  [facility]  PHYSICIAN:  Phylliss Bob, MD      DATE OF BIRTH:  03-04-1961  DATE OF PROCEDURE:  03/29/2015 DATE OF DISCHARGE:  03/29/2015                              OPERATIVE REPORT   PREOPERATIVE DIAGNOSES: 1. Left-sided L3 radiculopathy. 2. 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  POSTOPERATIVE DIAGNOSES: 1. Left-sided L3 radiculopathy. 2. 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  PROCEDURE:  Left-sided transpedicular decompression via a Wiltse approach.  SURGEON:  Phylliss Bob, MD  ASSISTANT:  Pricilla Holm, PA-C.  ANESTHESIA:  General endotracheal anesthesia.  COMPLICATIONS:  None.  DISPOSITION:  Stable.  ESTIMATED BLOOD LOSS:  Minimal.  INDICATIONS FOR SURGERY:  Briefly, [name] is a very pleasant 54 year old male who did present to me with ongoing pain in the left leg in the distribution of the L3 nerve.  The patient had been having pain for approximately 1 year.  The patient did go forward with appropriate conservative care in the form of a left L3 selective nerve block in addition to other forms of conservative care.  He did, however, have ongoing pain.  We therefore did discuss surgical intervention.  The patient did fully understand the risks and limitations of the procedure and did elect to proceed.  OPERATIVE DETAILS:  On March 29, 2015, the patient was brought to surgery and general endotracheal anesthesia was administered.  The patient was placed prone on a well-padded flat Jackson bed with a spinal frame.  Antibiotics were given and a time-out procedure was performed. The back was prepped and draped, and I then made a 2 inch incision approximately 3 cm lateral to the midline on the left side.  A  Wiltse plane was established.  A self-retaining retractor was placed.  A lateral intraoperative radiograph did confirm the appropriate operative level.  I then proceeded with identifying the lateral aspect of the lamina of L3.  A partial facetectomy was performed from lateral to medial involving the L3-4 facet joint on the left side.  The inner transverse membrane was identified and entered using a nerve hook.  I then was able to identify the exiting L3 nerve which was noted to be very adherent to a very significant prominence immediately beneath it. With additional dissection, I was able to develop a plane between the nerve and the disk herniation beneath it.  I then proceeded with removing multiple fragments of disk.  There were multiple disk fragments located in the foraminal region, superiorly behind the L3 vertebral body and in the far lateral and foraminal region in line with the intervertebral disk as well.  I was very pleased with the decompression. I then copiously irrigated the wound.  A 40 mg of Depo-Medrol was introduced about the region of the exiting L3 nerve on the left.  I then closed the fascia using #1 Vicryl.  The subcutaneous layer was closed using 2-0 Vicryl and the skin was closed using 3-0 Monocryl.  Benzoin and Steri-Strips were applied followed by sterile dressing.  All instrument counts were correct.  Of note, Pricilla Holm was my assistant throughout surgery and did aid in retraction, suctioning, and closure.     Phylliss Bob, MD     MD/MEDQ  D:  03/29/2015  T:  03/30/2015  Job:  [id number]

## 2017-02-03 ENCOUNTER — Encounter: Payer: Self-pay | Admitting: Internal Medicine

## 2017-03-26 ENCOUNTER — Encounter: Payer: Self-pay | Admitting: Internal Medicine

## 2017-03-26 ENCOUNTER — Encounter (INDEPENDENT_AMBULATORY_CARE_PROVIDER_SITE_OTHER): Payer: Self-pay

## 2017-03-26 ENCOUNTER — Ambulatory Visit (INDEPENDENT_AMBULATORY_CARE_PROVIDER_SITE_OTHER): Payer: BLUE CROSS/BLUE SHIELD | Admitting: Internal Medicine

## 2017-03-26 VITALS — BP 112/66 | HR 72 | Ht 70.0 in | Wt 169.0 lb

## 2017-03-26 DIAGNOSIS — Z8371 Family history of colonic polyps: Secondary | ICD-10-CM

## 2017-03-26 DIAGNOSIS — K219 Gastro-esophageal reflux disease without esophagitis: Secondary | ICD-10-CM

## 2017-03-26 NOTE — Progress Notes (Signed)
HISTORY OF PRESENT ILLNESS:  Jose Rodriguez is a 56 y.o. male who presents today with chief complaints of ongoing management of chronic GERD, coughing spells, excessive phlegm, need to continue PPI, and proper interval for surveillance colonoscopy. The patient has not been seen in this office since 2012 when he was evaluated for vague intermittent solid food dysphagia improved on PPI and colon cancer screening. He underwent upper endoscopy and colonoscopy. Upper endoscopy revealed a distal esophageal ring which was dilated. No other abnormalities. Was on omeprazole 40 mg daily at that time. Complete colonoscopy was normal. No polyps. Routine follow-up in 10 years recommended. Patient tells me that post-dilation he is well. No further problems with dysphagia. He did decrease his omeprazole from 40 mg daily down to 20 mg daily. He did this about 3 years ago. He does notice some phlegm buildup in the morning and dry mouth. Rest of the day he is okay. He also notices occasional coughing or choking when he eats or drinks noting that something is down the "wrong pipe". His weight has been stable. No melena or hematochezia. No lower GI complaints. He tells me that his brother had a colon polyp at 73 and was told to follow-up in 5 years. No family history of colon cancer. He tells me that he comes off PPI he has significant heartburn within 3 days.  REVIEW OF SYSTEMS:  All non-GI ROS negative except for cough, insomnia  Past Medical History:  Diagnosis Date  . Allergy    seasonal  . Cataract   . Esophageal reflux     Past Surgical History:  Procedure Laterality Date  . Cataract surgery  2008, 2010   Both eyes, different times.  [facility] EYE SURGERY    . LUMBAR LAMINECTOMY/DECOMPRESSION MICRODISCECTOMY N/A 03/29/2015   Procedure: LUMBAR LAMINECTOMY/DECOMPRESSION MICRODISCECTOMY LUMBAR THREE-FOUR;  Surgeon: Phylliss Bob, MD;  Location: [facility];  Service: Orthopedics;  Laterality: N/A;  Lumbar 3-4 decompression   . VARICOCELECTOMY     2001  in [place], [place]  reports that he has never smoked. He has never used smokeless tobacco. He reports that he drinks alcohol. He reports that he does not use drugs.  family history includes Asthma in his father; Cancer in his mother; Colon polyps in his father; Sleep apnea in his father.  No Known Allergies     PHYSICAL EXAMINATION: Vital signs: BP 112/66   Pulse 72   Ht 5\' 10"  (1.778 m)   Wt 169 lb (76.7 kg)   BMI 24.25 kg/m   Constitutional: generally well-appearing, no acute distress Psychiatric: alert and oriented x3, cooperative Eyes: extraocular movements intact, anicteric, conjunctiva pink Mouth: oral pharynx moist, no lesions Neck: supple no lymphadenopathy Cardiovascular: heart regular rate and rhythm, no murmur Lungs: clear to auscultation bilaterally Abdomen: soft, nontender, nondistended, no obvious ascites, no peritoneal signs, normal bowel sounds, no organomegaly Rectal:Omitted Extremities: no clubbing cyanosis or lower extremity edema bilaterally Skin: no lesions on visible extremities Neuro: No focal deficits. Cranial nerves intact  ASSESSMENT:  #1. GERD. Classic symptoms controlled with low-dose PPI daily #2. History of esophageal ring status post dilation with resolution of dysphagia #3. Chronic stable problems with pharyngeal phlegm in the morning as well as dry mouth and occasional coughing or choking with eating which has not progressed #4. Normal colonoscopy 2012   PLAN:  #1. Reflux precautions #2. Continue PPI. Lowest dose to control symptoms #3. Discussed long-term use of PPI and clinical concerns  of an raised #4. Surveillance colonoscopy around 2022 #5. Interval follow-up as needed

## 2017-03-26 NOTE — Patient Instructions (Signed)
Please follow up as needed 

## 2019-07-19 ENCOUNTER — Ambulatory Visit
Admission: EM | Admit: 2019-07-19 | Discharge: 2019-07-19 | Disposition: A | Discharge status: Home / Self Care | Payer: BC Managed Care – PPO

## 2019-07-19 ENCOUNTER — Encounter: Payer: Self-pay | Admitting: Emergency Medicine

## 2019-07-19 ENCOUNTER — Ambulatory Visit (HOSPITAL_COMMUNITY)
Admission: EM | Admit: 2019-07-19 | Discharge: 2019-07-19 | Disposition: A | Discharge status: Home / Self Care | Payer: BLUE CROSS/BLUE SHIELD

## 2019-07-19 ENCOUNTER — Other Ambulatory Visit: Payer: Self-pay

## 2019-07-19 DIAGNOSIS — S61210A Laceration without foreign body of right index finger without damage to nail, initial encounter: Secondary | ICD-10-CM

## 2019-07-19 DIAGNOSIS — Z23 Encounter for immunization: Secondary | ICD-10-CM

## 2019-07-19 DIAGNOSIS — W458XXA Other foreign body or object entering through skin, initial encounter: Secondary | ICD-10-CM | POA: Diagnosis not present

## 2019-07-19 MED ORDER — TETANUS-DIPHTH-ACELL PERTUSSIS 5-2.5-18.5 LF-MCG/0.5 IM SUSP
0.5000 mL | Freq: Once | INTRAMUSCULAR | Status: AC
  Administered 2019-07-19: 0.5 mL via INTRAMUSCULAR

## 2019-07-19 NOTE — Discharge Instructions (Addendum)
Keep area clean and dry. Do not use Neosporin as this can degrade sutures. Return in 1 week for suture removal. Return sooner for worsening pain, redness, discharge, fever.

## 2019-07-19 NOTE — ED Provider Notes (Signed)
[facility]    CSN: [id number] Arrival date & time: 07/19/19  1233      History   Chief Complaint Chief Complaint  Patient presents with  . Laceration    HPI Jose Rodriguez is a 58 y.o. male history allergies, GERD presenting for right index finger laceration second metal object that occurred 2 hours PTA.  Hemostasis achieved PTA with direct pressure.  Denies anticoagulant/blood thinner use, prolonged bleeding.  Was able to irrigate at home with tap water for greater than 1 minute.  No foreign body identified.  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  Past Medical History:  Diagnosis Date  . Allergy    seasonal  . Cataract   . Esophageal reflux     There are no problems to display for this patient.   Past Surgical History:  Procedure Laterality Date  . Cataract surgery  2008, 2010   Both eyes, different times.  [facility] EYE SURGERY    . LUMBAR LAMINECTOMY/DECOMPRESSION MICRODISCECTOMY N/A 03/29/2015   Procedure: LUMBAR LAMINECTOMY/DECOMPRESSION MICRODISCECTOMY LUMBAR THREE-FOUR;  Surgeon: Phylliss Bob, MD;  Location: [facility];  Service: Orthopedics;  Laterality: N/A;  Lumbar 3-4 decompression  . VARICOCELECTOMY     2001  in [place], [place]       Home Medications    Prior to Admission medications   Medication Sig Start Date End Date Taking? Authorizing Provider  cetirizine (ZYRTEC) 10 MG chewable tablet Chew 10 mg by mouth daily.   Yes [provider]  omeprazole (PRILOSEC) 20 MG capsule Take 20 mg by mouth daily. Take 1 cap in the Am and before dinner.     [provider]    Family History Family History  Problem Relation Age of Onset  . Asthma Father        Brother   . Sleep apnea Father        Uses CPAP  . Colon polyps Father        polyps removed  . Cancer Mother        Breast cancer survivor    Social History Social History   Tobacco Use  . Smoking status: Never Smoker  . Smokeless tobacco: Never Used   Substance Use Topics  . Alcohol use: Yes    Comment: Very rarely.  . Drug use: No     Allergies   Patient has no known allergies.   Review of Systems Review of Systems  Constitutional: Negative for fatigue and fever.  Respiratory: Negative for cough and shortness of breath.   Cardiovascular: Negative for chest pain and palpitations.  Gastrointestinal: Negative for abdominal pain, diarrhea and vomiting.  Musculoskeletal: Negative for arthralgias and myalgias.  Skin: Positive for wound. Negative for rash.  Neurological: Negative for speech difficulty and headaches.  All other systems reviewed and are negative.    Physical Exam Triage Vital Signs ED Triage Vitals  Enc Vitals Group     BP 07/19/19 1305 (!) 162/70     Pulse Rate 07/19/19 1305 71     Resp 07/19/19 1305 16     Temp 07/19/19 1305 97.9 F (36.  6 C)     Temp Source 07/19/19 1305 Temporal     SpO2 07/19/19 1305 97 %     Weight --      Height --      Head Circumference --      Peak Flow --      Pain Score 07/19/19 1306 4  Pain Loc --      Pain Edu? --      Excl. in [place]? --    No data found.  Updated Vital Signs BP (!) 162/70 (BP Location: Left Arm)   Pulse 71   Temp 97.9 F (36.6 C) (Temporal)   Resp 16   SpO2 97%   Visual Acuity Right Eye Distance:   Left Eye Distance:   Bilateral Distance:    Right Eye Near:   Left Eye Near:    Bilateral Near:     Physical Exam Constitutional:      General: He is not in acute distress. HENT:     Head: Normocephalic and atraumatic.  Eyes:     General: No scleral icterus.    Pupils: Pupils are equal, round, and reactive to light.  Cardiovascular:     Rate and Rhythm: Normal rate.  Pulmonary:     Effort: Pulmonary effort is normal. No respiratory distress.     Breath sounds: No wheezing.  Musculoskeletal:        General: Signs of injury present. No swelling or tenderness. Normal range of motion.     Comments: Neurovascularly intact with 5/5  strength  Skin:    Coloration: Skin is not jaundiced or pale.     Comments: 1 cm superficial laceration over PIP of right index finger.  Does not penetrate into muscle, fascia, bone.  Neurological:     Mental Status: He is alert and oriented to person, place, and time.      UC Treatments / Results  Labs (all labs ordered are listed, but only abnormal results are displayed) Labs Reviewed - No data to display  EKG   Radiology No results found.  Procedures Laceration Repair  Date/Time: 07/20/2019 8:14 AM Performed by: Quincy Sheehan, PA-C Authorized by: Quincy Sheehan, PA-C   Consent:    Consent obtained:  Verbal   Consent given by:  Patient   Risks discussed:  Infection, need for additional repair, pain, poor cosmetic result and poor wound healing   Alternatives discussed:  No treatment and delayed treatment Universal protocol:    Patient identity confirmed:  Verbally with patient Anesthesia (see MAR for exact dosages):    Anesthesia method:  Nerve block   Block anesthetic:  Lidocaine 2% w/o epi   Block technique:  Digital   Block injection procedure:  Anatomic landmarks identified, anatomic landmarks palpated, introduced needle, negative aspiration for blood and incremental injection   Block outcome:  Anesthesia achieved Laceration details:    Location:  Finger   Finger location:  R index finger   Length (cm):  1   Depth (mm):  3 Repair type:    Repair type:  Simple Pre-procedure details:    Preparation:  Patient was prepped and draped in usual sterile fashion Exploration:    Hemostasis achieved with:  Direct pressure   Wound exploration: wound explored through full range of motion     Wound extent: no fascia violation noted, no foreign bodies/material noted, no muscle damage noted, no nerve damage noted and no tendon damage noted   Treatment:    Area cleansed with:  Soap and water   Amount of cleaning:  Extensive   Irrigation solution:  Sterile  saline and tap water   Irrigation volume:  2 min   Irrigation method:  Tap Skin repair:    Repair method:  Sutures   Suture size:  4-0   Suture material:  Prolene   Suture technique:  Simple  interrupted   Number of sutures:  3 Approximation:    Approximation:  Close Post-procedure details:    Dressing:  Non-adherent dressing   Patient tolerance of procedure:  Tolerated well, no immediate complications   (including critical care time)  Medications Ordered in UC Medications  Tdap (BOOSTRIX) injection 0.5 mL (0.5 mLs Intramuscular Given 07/19/19 1423)    Initial Impression / Assessment and Plan / UC Course  I have reviewed the triage vital signs and the nursing notes.  Pertinent labs & imaging results that were available during my care of the patient were reviewed by me and considered in my medical decision making (see chart for details).     Patient undergoing 3 simple interrupted suture placement which she tolerated well.  Low concern for foreign body exposure, contamination, risk of infection given patient's overall health status, thorough irrigation x2.  Will defer antibiotics at this time.  Reviewed wound care as outlined below.  Return in 7 to 10 days for suture removal.  Return precautions discussed, patient verbalized understanding and is agreeable to plan. Final Clinical Impressions(s) / UC Diagnoses   Final diagnoses:  Laceration of right index finger without foreign body without damage to nail, initial encounter     Discharge Instructions     Keep area clean and dry. Do not use Neosporin as this can degrade sutures. Return in 1 week for suture removal. Return sooner for worsening pain, redness, discharge, fever.    ED Prescriptions    None     PDMP not reviewed this encounter.   [place], [place], [place] 07/20/19 [phone]

## 2019-07-19 NOTE — ED Triage Notes (Signed)
Pt presents to [facility] for assessment after sliding a shelf off the wall which cut him with a metal side of an old medicine cabinet to the pointer finger of the right hand.  Patient unsure of last tetanus.

## 2019-07-19 NOTE — ED Notes (Signed)
Patient able to ambulate independently  

## 2019-07-19 NOTE — ED Notes (Signed)
Patient presented w/ minor laceration to finger. Due to wait time, the patient decided to leave to be seen at another [facility] location. Pt ambulatory out of building with family.

## 2019-07-20 DIAGNOSIS — W458XXA Other foreign body or object entering through skin, initial encounter: Secondary | ICD-10-CM | POA: Diagnosis not present

## 2019-07-20 DIAGNOSIS — S61210A Laceration without foreign body of right index finger without damage to nail, initial encounter: Secondary | ICD-10-CM | POA: Diagnosis not present

## 2021-06-04 ENCOUNTER — Encounter: Payer: Self-pay | Admitting: Internal Medicine

## 2021-07-11 ENCOUNTER — Encounter: Payer: Self-pay | Admitting: Internal Medicine

## 2021-08-20 ENCOUNTER — Ambulatory Visit (AMBULATORY_SURGERY_CENTER): Payer: Self-pay | Admitting: *Deleted

## 2021-08-20 ENCOUNTER — Other Ambulatory Visit: Payer: Self-pay

## 2021-08-20 VITALS — Ht 70.0 in | Wt 169.0 lb

## 2021-08-20 DIAGNOSIS — Z1211 Encounter for screening for malignant neoplasm of colon: Secondary | ICD-10-CM

## 2021-08-20 MED ORDER — SUTAB 1479-225-188 MG PO TABS: 1.0000 | ORAL_TABLET | ORAL | 0 refills | Status: DC

## 2021-08-20 NOTE — Progress Notes (Signed)
No egg or soy allergy known to patient  No issues known to pt with past sedation with any surgeries or procedures Patient denies ever being told they had issues or difficulty with intubation  No FH of Malignant Hyperthermia Pt is not on diet pills Pt is not on  home 02  Pt is not on blood thinners  Pt denies issues with constipation  No A fib or A flutter  Pt is fully vaccinated  for Covid   Sutab Coupon to pt in PV today , Code to Pharmacy and  NO PA's for preps discussed with pt In PV today  Discussed with pt there will be an out-of-pocket cost for prep and that varies from $0 to 70 +  dollars - pt verbalized understanding   Due to the COVID-19 pandemic we are asking patients to follow certain guidelines in PV and the [place]   Pt aware of COVID protocols and [facility] guidelines   PV completed over the phone. Pt verified name, DOB, address and insurance during PV today.  Pt mailed instruction packet with copy of consent form to read and not return, and instructions.  Pt encouraged to call with questions or issues.  If pt has My chart, procedure instructions sent via My Chart

## 2021-08-23 ENCOUNTER — Encounter: Payer: Self-pay | Admitting: Internal Medicine

## 2021-08-28 ENCOUNTER — Ambulatory Visit (AMBULATORY_SURGERY_CENTER): Payer: BC Managed Care – PPO | Admitting: Internal Medicine

## 2021-08-28 ENCOUNTER — Encounter: Payer: BC Managed Care – PPO | Admitting: Internal Medicine

## 2021-08-28 ENCOUNTER — Encounter: Payer: Self-pay | Admitting: Internal Medicine

## 2021-08-28 ENCOUNTER — Other Ambulatory Visit: Payer: Self-pay

## 2021-08-28 VITALS — BP 109/69 | HR 73 | Temp 97.8°F | Resp 14 | Ht 70.0 in | Wt 169.0 lb

## 2021-08-28 DIAGNOSIS — D122 Benign neoplasm of ascending colon: Secondary | ICD-10-CM

## 2021-08-28 DIAGNOSIS — Z1211 Encounter for screening for malignant neoplasm of colon: Secondary | ICD-10-CM

## 2021-08-28 MED ORDER — SODIUM CHLORIDE 0.9 % IV SOLN: 500.0000 mL | Freq: Once | INTRAVENOUS | Status: DC

## 2021-08-28 NOTE — Progress Notes (Signed)
To Pacu, VSS. Report to [email or web address] 

## 2021-08-28 NOTE — Progress Notes (Signed)
CNW - VS   Pt's states no medical or surgical changes since previsit or office visit.

## 2021-08-28 NOTE — Patient Instructions (Addendum)
Handout on polyps and Hemorrhoids  provided.  Await pathology results.   YOU HAD AN ENDOSCOPIC PROCEDURE TODAY AT [facility]:   Refer to the procedure report that was given to you for any specific questions about what was found during the examination.  If the procedure report does not answer your questions, please call your gastroenterologist to clarify.  If you requested that your care partner not be given the details of your procedure findings, then the procedure report has been included in a sealed envelope for you to review at your convenience later.  YOU SHOULD EXPECT: Some feelings of bloating in the abdomen. Passage of more gas than usual.  Walking can help get rid of the air that was put into your GI tract during the procedure and reduce the bloating. If you had a lower endoscopy (such as a colonoscopy or flexible sigmoidoscopy) you may notice spotting of blood in your stool or on the toilet paper. If you underwent a bowel prep for your procedure, you may not have a normal bowel movement for a few days.  Please Note:  You might notice some irritation and congestion in your nose or some drainage.  This is from the oxygen used during your procedure.  There is no need for concern and it should clear up in a day or so.  SYMPTOMS TO REPORT IMMEDIATELY:  Following lower endoscopy (colonoscopy or flexible sigmoidoscopy):  Excessive amounts of blood in the stool  Significant tenderness or worsening of abdominal pains  Swelling of the abdomen that is new, acute  Fever of 100F or higher  For urgent or emergent issues, a gastroenterologist can be reached at any hour by calling [phone]. Do not use MyChart messaging for urgent concerns.    DIET:  We do recommend a small meal at first, but then you may proceed to your regular diet.  Drink plenty of fluids but you should avoid alcoholic beverages for 24 hours.  ACTIVITY:  You should plan to take it easy for the rest of  today and you should NOT DRIVE or use heavy machinery until tomorrow (because of the sedation medicines used during the test).    FOLLOW UP: Our staff will call the number listed on your records 48-72 hours following your procedure to check on you and address any questions or concerns that you may have regarding the information given to you following your procedure. If we do not reach you, we will leave a message.  We will attempt to reach you two times.  During this call, we will ask if you have developed any symptoms of COVID 19. If you develop any symptoms (ie: fever, flu-like symptoms, shortness of breath, cough etc.) before then, please call [phone].  If you test positive for Covid 19 in the 2 weeks post procedure, please call and report this information to [place].    If any biopsies were taken you will be contacted by phone or by letter within the next 1-3 weeks.  Please call us at [phone] if you have not heard about the biopsies in 3 weeks.    SIGNATURES/CONFIDENTIALITY: You and/or your care partner have signed paperwork which will be entered into your electronic medical record.  These signatures attest to the fact that that the information above on your After Visit Summary has been reviewed and is understood.  Full responsibility of the confidentiality of this discharge information lies with you and/or your care-partner.

## 2021-08-28 NOTE — Progress Notes (Signed)
Called to room to assist during endoscopic procedure.  Patient ID and intended procedure confirmed with present staff. Received instructions for my participation in the procedure from the performing physician.  

## 2021-08-28 NOTE — Op Note (Signed)
[place] Patient Name: Jose Rodriguez Procedure Date: 08/28/2021 9:12 AM MRN: [id number] Endoscopist: Docia Chuck. Henrene Pastor , MD Age: 60 Referring MD:  Date of Birth: 06-09-1961 Gender: Male Account #: [id number] Procedure:                Colonoscopy with cold snare polypectomy x 1 Indications:              Screening for colorectal malignant neoplasm.                            Negative index exam 2012 Medicines:                Monitored Anesthesia Care Procedure:                Pre-Anesthesia Assessment:                           - Prior to the procedure, a History and Physical                            was performed, and patient medications and                            allergies were reviewed. The patient's tolerance of                            previous anesthesia was also reviewed. The risks                            and benefits of the procedure and the sedation                            options and risks were discussed with the patient.                            All questions were answered, and informed consent                            was obtained. Prior Anticoagulants: The patient has                            taken no previous anticoagulant or antiplatelet                            agents. ASA Grade Assessment: II - A patient with                            mild systemic disease. After reviewing the risks                            and benefits, the patient was deemed in                            satisfactory condition to undergo the procedure.  After obtaining informed consent, the colonoscope                            was passed under direct vision. Throughout the                            procedure, the patient's blood pressure, pulse, and                            oxygen saturations were monitored continuously. The                            Olympus CF-HQ190L [id number]) Colonoscope was                            introduced  through the anus and advanced to the the                            cecum, identified by appendiceal orifice and                            ileocecal valve. The ileocecal valve, appendiceal                            orifice, and rectum were photographed. The quality                            of the bowel preparation was excellent. The                            colonoscopy was performed without difficulty. The                            patient tolerated the procedure well. The bowel                            preparation used was SUPREP/tablets via split dose                            instruction. Scope In: 9:25:27 AM Scope Out: 9:37:21 AM Scope Withdrawal Time: 0 hours 10 minutes 20 seconds  Total Procedure Duration: 0 hours 11 minutes 54 seconds  Findings:                 A 3 mm polyp was found in the ascending colon. The                            polyp was sessile. The polyp was removed with a                            cold snare. Resection and retrieval were complete.                           Internal hemorrhoids were found during  retroflexion. The hemorrhoids were small.                           The exam was otherwise without abnormality on                            direct and retroflexion views. Complications:            No immediate complications. Estimated blood loss:                            None. Estimated Blood Loss:     Estimated blood loss: none. Impression:               - One 3 mm polyp in the ascending colon, removed                            with a cold snare. Resected and retrieved.                           - Internal hemorrhoids.                           - The examination was otherwise normal on direct                            and retroflexion views. Recommendation:           - Repeat colonoscopy in 7-10 years for surveillance.                           - Patient has a contact number available for                             emergencies. The signs and symptoms of potential                            delayed complications were discussed with the                            patient. Return to normal activities tomorrow.                            Written discharge instructions were provided to the                            patient.                           - Resume previous diet.                           - Continue present medications.                           - Await pathology results. Docia Chuck. Henrene Pastor, MD 08/28/2021 9:43:06 AM This report has been signed electronically.

## 2021-08-28 NOTE — Progress Notes (Signed)
HISTORY OF PRESENT ILLNESS:  Jose Rodriguez is a 61 y.o. male who presents today for screening colonoscopy.  Negative index exam December 2012.  No active complaints.  No family history.  Tolerated prep well  REVIEW OF SYSTEMS:  All non-GI ROS negative. Past Medical History:  Diagnosis Date   Allergy    seasonal   Cataract    Esophageal reflux     Past Surgical History:  Procedure Laterality Date   Cataract surgery  2008, 2010   Both eyes, different times.   EYE SURGERY     LUMBAR LAMINECTOMY/DECOMPRESSION MICRODISCECTOMY N/A 03/29/2015   Procedure: LUMBAR LAMINECTOMY/DECOMPRESSION MICRODISCECTOMY LUMBAR THREE-FOUR;  Surgeon: Phylliss Bob, MD;  Location: [facility];  Service: Orthopedics;  Laterality: N/A;  Lumbar 3-4 decompression   VARICOCELECTOMY     2001  in [place], [place]  reports that he has never smoked. He has never used smokeless tobacco. He reports current alcohol use. He reports that he does not use drugs.  family history includes Asthma in his father; Cancer in his mother; Colon polyps in his brother and father; Sleep apnea in his father.  No Known Allergies     PHYSICAL EXAMINATION:  Vital signs: BP 90/63    Pulse 68    Temp 97.8 F (36.6 C)    Resp 13    Ht 5\' 10"  (1.778 m)    Wt 169 lb (76.7 kg)    SpO2 97%    BMI 24.25 kg/m  General: Well-developed, well-nourished, no acute distress HEENT: Sclerae are anicteric, conjunctiva pink. Oral mucosa intact Lungs: Clear Heart: Regular Abdomen: soft, nontender, nondistended, no obvious ascites, no peritoneal signs, normal bowel sounds. No organomegaly. Extremities: No edema Psychiatric: alert and oriented x3. Cooperative     ASSESSMENT:  Colon cancer screening  PLAN:   Screening colonoscopy

## 2021-08-30 ENCOUNTER — Encounter: Payer: Self-pay | Admitting: Internal Medicine

## 2021-08-30 ENCOUNTER — Telehealth: Payer: Self-pay | Admitting: *Deleted

## 2021-08-30 NOTE — Telephone Encounter (Signed)
°  Follow up Call-  Call back number 08/28/2021 08/28/2021  Post procedure Call Back phone  # [phone] cell [phone] cell  Permission to leave phone message Yes Yes  Some recent data might be hidden     Patient questions:  Do you have a fever, pain , or abdominal swelling? No. Pain Score  0 *  Have you tolerated food without any problems? Yes.    Have you been able to return to your normal activities? Yes.    Do you have any questions about your discharge instructions: Diet   No. Medications  No. Follow up visit  No.  Do you have questions or concerns about your Care? No.  Actions: * If pain score is 4 or above: No action needed, pain <4.
